# Patient Record
Sex: Male | Born: 1960 | Race: White | Hispanic: No | Marital: Single | Smoking: Former smoker
Health system: Southern US, Community
[De-identification: ages and names within clinical notes are randomized; demographics above are authoritative.]

---

## 1999-11-01 ENCOUNTER — Emergency Department (HOSPITAL_COMMUNITY): Admission: EM | Admit: 1999-11-01 | Discharge: 1999-11-01 | Payer: Self-pay | Admitting: Emergency Medicine

## 2000-05-04 ENCOUNTER — Inpatient Hospital Stay (HOSPITAL_COMMUNITY): Admission: EM | Admit: 2000-05-04 | Discharge: 2000-05-06 | Payer: Self-pay | Admitting: Addiction Psychiatry

## 2000-09-21 ENCOUNTER — Emergency Department (HOSPITAL_COMMUNITY): Admission: EM | Admit: 2000-09-21 | Discharge: 2000-09-21 | Payer: Self-pay | Admitting: Emergency Medicine

## 2006-05-01 ENCOUNTER — Emergency Department (HOSPITAL_COMMUNITY): Admission: EM | Admit: 2006-05-01 | Discharge: 2006-05-01 | Payer: Self-pay | Admitting: Family Medicine

## 2006-09-03 ENCOUNTER — Emergency Department (HOSPITAL_COMMUNITY): Admission: EM | Admit: 2006-09-03 | Discharge: 2006-09-03 | Payer: Self-pay | Admitting: Family Medicine

## 2006-10-28 ENCOUNTER — Emergency Department (HOSPITAL_COMMUNITY): Admission: EM | Admit: 2006-10-28 | Discharge: 2006-10-28 | Payer: Self-pay | Admitting: Family Medicine

## 2007-08-20 ENCOUNTER — Emergency Department (HOSPITAL_COMMUNITY): Admission: EM | Admit: 2007-08-20 | Discharge: 2007-08-20 | Payer: Self-pay | Admitting: Emergency Medicine

## 2009-11-17 ENCOUNTER — Ambulatory Visit (HOSPITAL_COMMUNITY): Admission: RE | Admit: 2009-11-17 | Discharge: 2009-11-17 | Payer: Self-pay | Admitting: Pulmonary Disease

## 2010-10-03 ENCOUNTER — Emergency Department (HOSPITAL_COMMUNITY): Admission: EM | Admit: 2010-10-03 | Discharge: 2010-10-03 | Payer: Self-pay | Admitting: Family Medicine

## 2010-10-03 ENCOUNTER — Emergency Department (HOSPITAL_COMMUNITY): Admission: EM | Admit: 2010-10-03 | Discharge: 2010-10-03 | Payer: Self-pay | Admitting: Emergency Medicine

## 2010-12-08 ENCOUNTER — Encounter
Admission: RE | Admit: 2010-12-08 | Discharge: 2010-12-12 | Payer: Self-pay | Source: Home / Self Care | Attending: Diagnostic Neuroimaging | Admitting: Diagnostic Neuroimaging

## 2010-12-13 ENCOUNTER — Ambulatory Visit: Payer: MEDICARE | Attending: Diagnostic Neuroimaging | Admitting: Physical Therapy

## 2010-12-13 DIAGNOSIS — M25579 Pain in unspecified ankle and joints of unspecified foot: Secondary | ICD-10-CM | POA: Insufficient documentation

## 2010-12-13 DIAGNOSIS — M629 Disorder of muscle, unspecified: Secondary | ICD-10-CM | POA: Insufficient documentation

## 2010-12-13 DIAGNOSIS — IMO0001 Reserved for inherently not codable concepts without codable children: Secondary | ICD-10-CM | POA: Insufficient documentation

## 2010-12-13 DIAGNOSIS — M2420 Disorder of ligament, unspecified site: Secondary | ICD-10-CM | POA: Insufficient documentation

## 2010-12-13 DIAGNOSIS — R269 Unspecified abnormalities of gait and mobility: Secondary | ICD-10-CM | POA: Insufficient documentation

## 2010-12-15 ENCOUNTER — Ambulatory Visit: Payer: MEDICARE | Admitting: Physical Therapy

## 2010-12-19 ENCOUNTER — Ambulatory Visit: Payer: Self-pay | Admitting: Physical Therapy

## 2010-12-21 ENCOUNTER — Ambulatory Visit: Payer: Self-pay | Admitting: Physical Therapy

## 2010-12-26 ENCOUNTER — Ambulatory Visit: Payer: MEDICARE | Admitting: Physical Therapy

## 2010-12-29 ENCOUNTER — Ambulatory Visit: Payer: MEDICARE | Admitting: Physical Therapy

## 2011-01-02 ENCOUNTER — Ambulatory Visit: Payer: MEDICARE | Admitting: Physical Therapy

## 2011-01-04 ENCOUNTER — Ambulatory Visit: Payer: Self-pay | Admitting: Physical Therapy

## 2011-01-09 ENCOUNTER — Ambulatory Visit: Payer: MEDICARE | Admitting: Physical Therapy

## 2011-01-12 ENCOUNTER — Ambulatory Visit: Payer: MEDICARE | Admitting: Physical Therapy

## 2011-01-15 ENCOUNTER — Ambulatory Visit: Payer: MEDICARE | Admitting: Physical Therapy

## 2011-01-17 ENCOUNTER — Ambulatory Visit: Payer: MEDICARE | Admitting: Physical Therapy

## 2011-01-22 ENCOUNTER — Ambulatory Visit: Payer: MEDICARE | Admitting: Physical Therapy

## 2011-01-24 ENCOUNTER — Ambulatory Visit: Payer: MEDICARE | Admitting: Physical Therapy

## 2011-01-30 ENCOUNTER — Ambulatory Visit: Payer: MEDICARE | Admitting: Physical Therapy

## 2011-02-01 ENCOUNTER — Ambulatory Visit: Payer: MEDICARE | Admitting: Physical Therapy

## 2011-05-07 IMAGING — CT CT HEAD W/O CM
1 series · 16 of 30 positions shown, 20 images · non-contrast
Comparison: None

CLINICAL DATA: Memory loss

CT HEAD WITHOUT CONTRAST
TECHNIQUE: Contiguous axial images were obtained from the base of
the skull through the vertex without contrast.

[Series 2: headseq 4.8 h37s · axial · 0.45mm/px · z∈[+140,+295]mm · 16 of 36 slices shown, 20 images]
[im 2/36  brain]
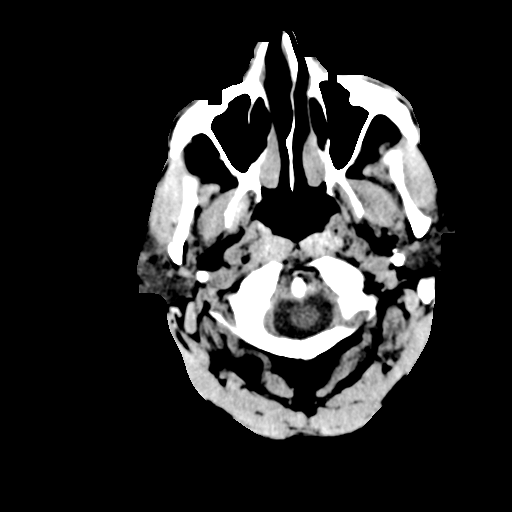
[im 2/36  bone]
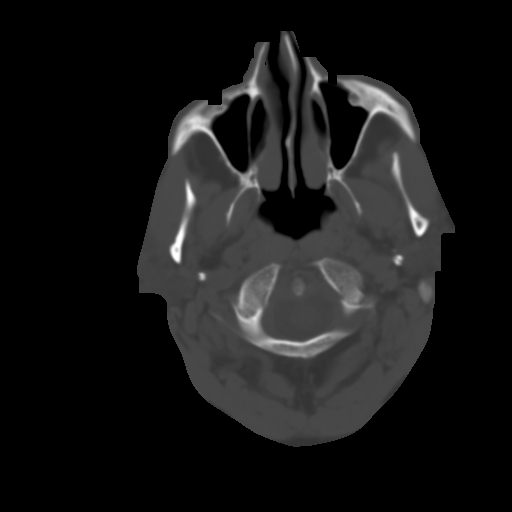
[im 4/36  brain]
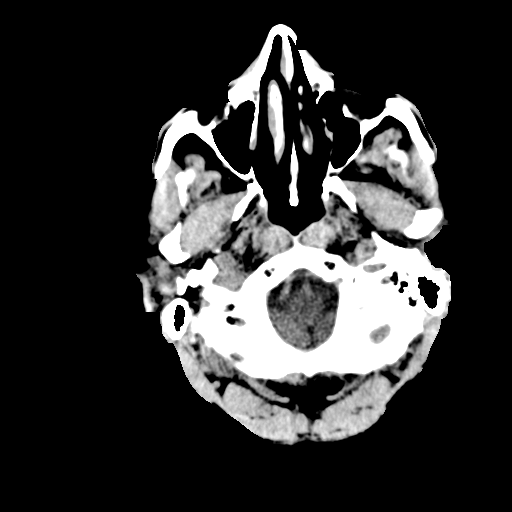
[im 7/36  brain]
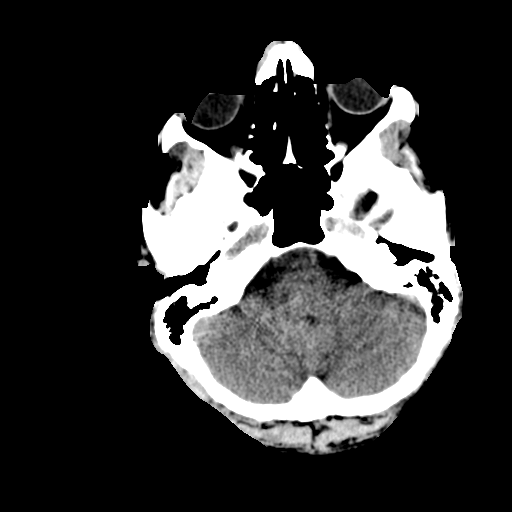
[im 9/36  brain]
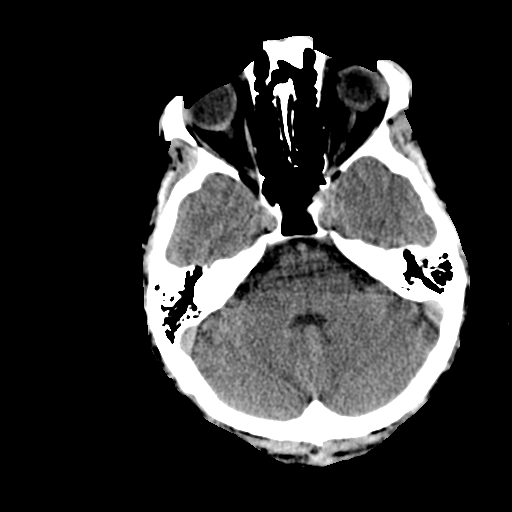
[im 10/36  brain]
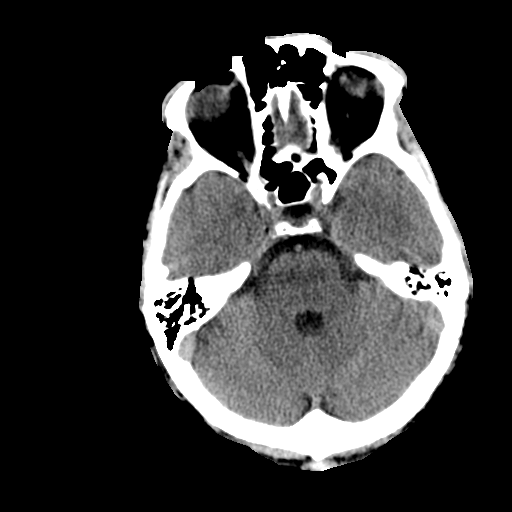
[im 10/36  bone]
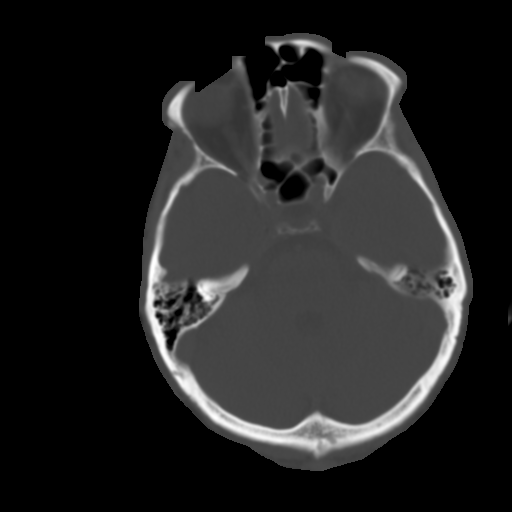
[im 13/36  brain]
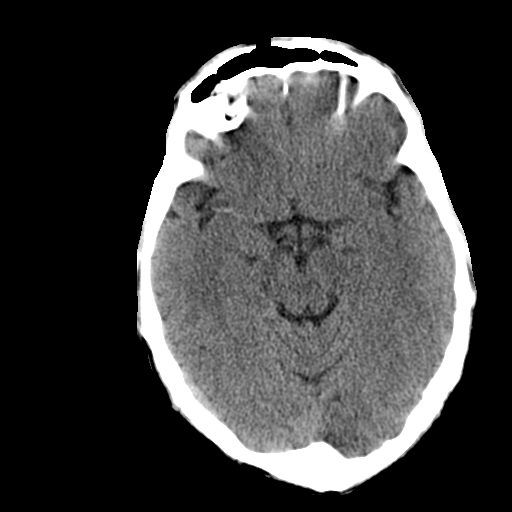
[im 15/36  brain]
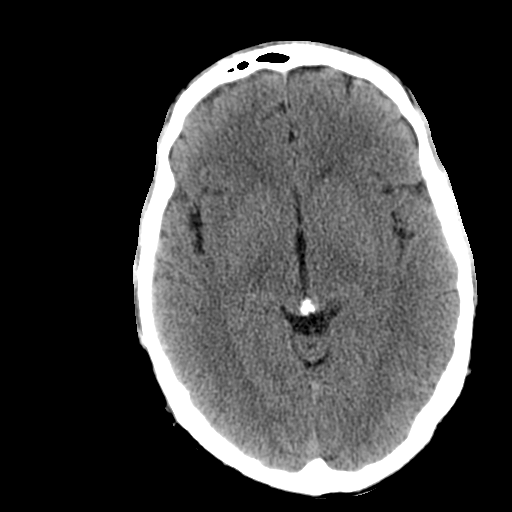
[im 17/36  brain]
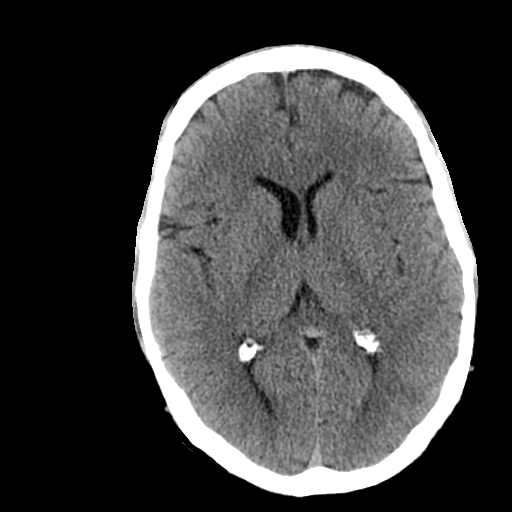
[im 19/36  brain]
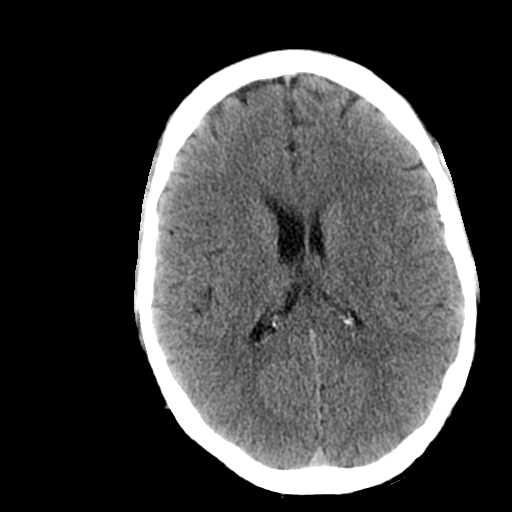
[im 19/36  bone]
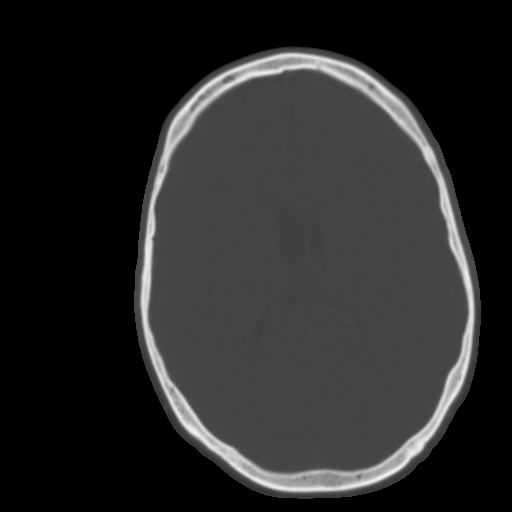
[im 21/36  brain]
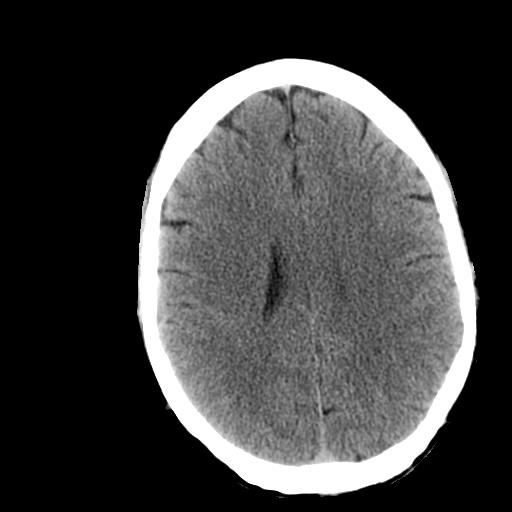
[im 23/36  brain]
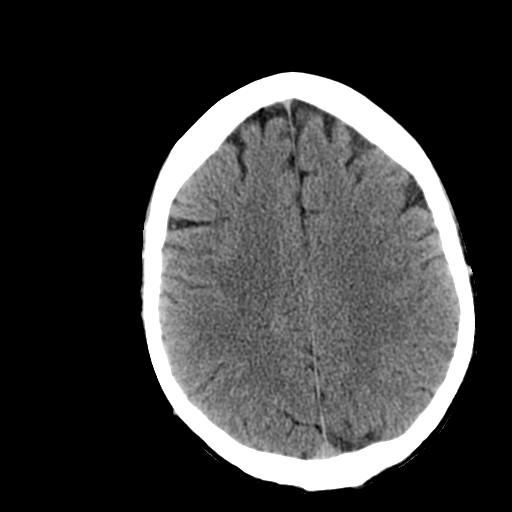
[im 26/36  brain]
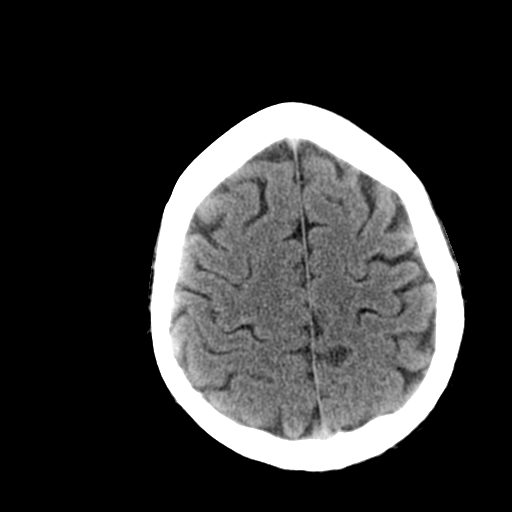
[im 27/36  brain]
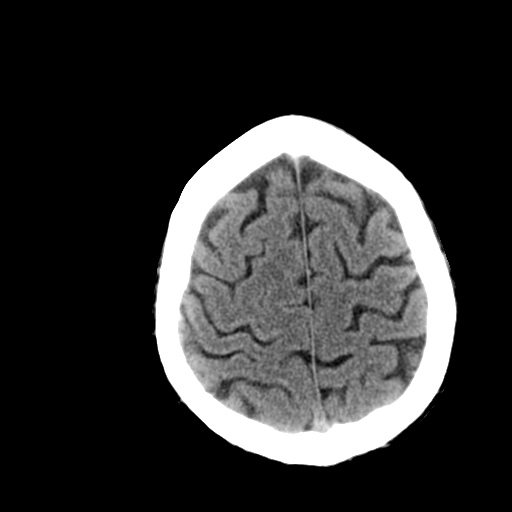
[im 27/36  bone]
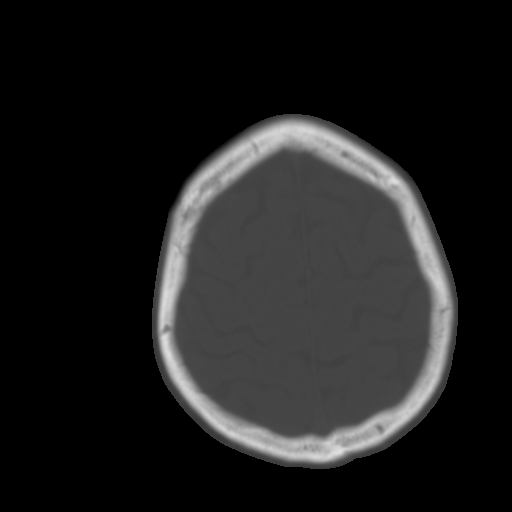
[im 29/36  brain]
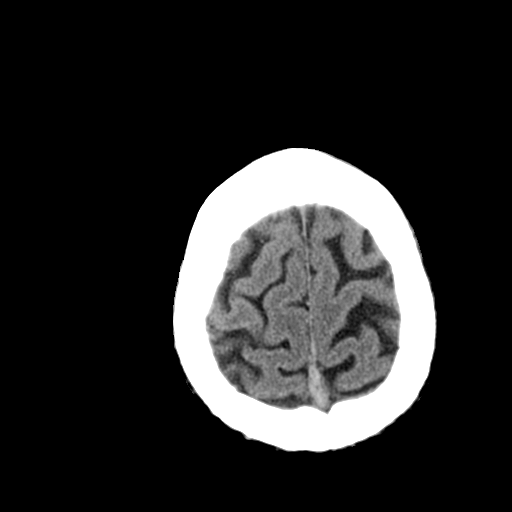
[im 32/36  brain]
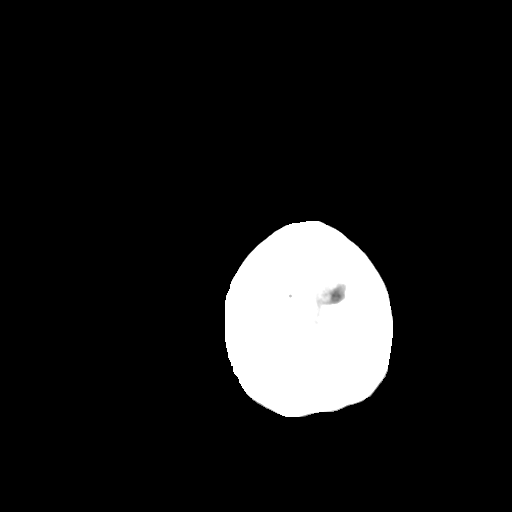
[im 34/36  brain]
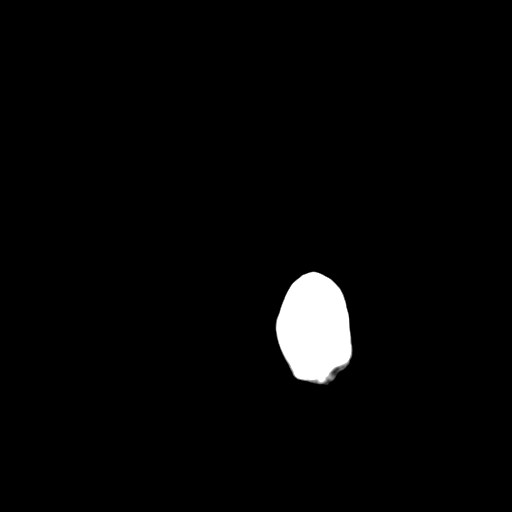

[16 of 30 positions shown; findings below may reference images not displayed]

FINDINGS: Normal ventricular morphology.
No midline shift or mass effect.
Normal appearance of brain parenchyma.
No intracranial hemorrhage, mass lesion, or acute infarction.
Visualized paranasal sinuses and mastoid air cells clear.
Bones unremarkable.
IMPRESSION: No acute intracranial abnormalities.

## 2011-08-15 ENCOUNTER — Inpatient Hospital Stay (INDEPENDENT_AMBULATORY_CARE_PROVIDER_SITE_OTHER)
Admission: RE | Admit: 2011-08-15 | Discharge: 2011-08-15 | Disposition: A | Discharge status: Home / Self Care | Payer: Medicare Other | Source: Ambulatory Visit | Attending: Emergency Medicine | Admitting: Emergency Medicine

## 2011-08-15 DIAGNOSIS — K5289 Other specified noninfective gastroenteritis and colitis: Secondary | ICD-10-CM

## 2011-08-15 DIAGNOSIS — K649 Unspecified hemorrhoids: Secondary | ICD-10-CM

## 2011-08-15 LAB — OCCULT BLOOD, POC DEVICE: Fecal Occult Bld: POSITIVE

## 2011-08-23 LAB — I-STAT 8, (EC8 V) (CONVERTED LAB)
Acid-Base Excess: 3 — ABNORMAL HIGH
Bicarbonate: 27.4 — ABNORMAL HIGH
Glucose, Bld: 93
HCT: 49
Hemoglobin: 16.7
TCO2: 29
pCO2, Ven: 38.5 — ABNORMAL LOW

## 2011-08-23 LAB — TSH: TSH: 0.533

## 2013-11-17 ENCOUNTER — Ambulatory Visit (INDEPENDENT_AMBULATORY_CARE_PROVIDER_SITE_OTHER): Payer: 59 | Admitting: Professional

## 2013-11-17 DIAGNOSIS — F411 Generalized anxiety disorder: Secondary | ICD-10-CM

## 2013-11-24 ENCOUNTER — Ambulatory Visit (INDEPENDENT_AMBULATORY_CARE_PROVIDER_SITE_OTHER): Payer: 59 | Admitting: Professional

## 2013-11-24 DIAGNOSIS — F411 Generalized anxiety disorder: Secondary | ICD-10-CM

## 2013-12-01 ENCOUNTER — Ambulatory Visit (INDEPENDENT_AMBULATORY_CARE_PROVIDER_SITE_OTHER): Payer: 59 | Admitting: Professional

## 2013-12-01 DIAGNOSIS — F411 Generalized anxiety disorder: Secondary | ICD-10-CM

## 2013-12-08 ENCOUNTER — Ambulatory Visit (INDEPENDENT_AMBULATORY_CARE_PROVIDER_SITE_OTHER): Payer: 59 | Admitting: Professional

## 2013-12-08 DIAGNOSIS — F411 Generalized anxiety disorder: Secondary | ICD-10-CM

## 2013-12-15 ENCOUNTER — Ambulatory Visit (INDEPENDENT_AMBULATORY_CARE_PROVIDER_SITE_OTHER): Payer: 59 | Admitting: Professional

## 2013-12-15 DIAGNOSIS — F411 Generalized anxiety disorder: Secondary | ICD-10-CM

## 2013-12-22 ENCOUNTER — Ambulatory Visit (INDEPENDENT_AMBULATORY_CARE_PROVIDER_SITE_OTHER): Payer: 59 | Admitting: Professional

## 2013-12-22 DIAGNOSIS — F411 Generalized anxiety disorder: Secondary | ICD-10-CM

## 2014-03-22 ENCOUNTER — Ambulatory Visit: Payer: Self-pay | Admitting: Diagnostic Neuroimaging

## 2014-03-24 ENCOUNTER — Ambulatory Visit (INDEPENDENT_AMBULATORY_CARE_PROVIDER_SITE_OTHER): Payer: Medicare Other | Admitting: Diagnostic Neuroimaging

## 2014-03-24 ENCOUNTER — Encounter: Payer: Self-pay | Admitting: Diagnostic Neuroimaging

## 2014-03-24 ENCOUNTER — Ambulatory Visit: Payer: Self-pay | Admitting: Nurse Practitioner

## 2014-03-24 ENCOUNTER — Encounter (INDEPENDENT_AMBULATORY_CARE_PROVIDER_SITE_OTHER): Payer: Self-pay

## 2014-03-24 VITALS — BP 106/76 | HR 75 | Ht 68.0 in | Wt 175.0 lb

## 2014-03-24 DIAGNOSIS — G801 Spastic diplegic cerebral palsy: Secondary | ICD-10-CM

## 2014-03-24 DIAGNOSIS — G808 Other cerebral palsy: Secondary | ICD-10-CM

## 2014-03-24 NOTE — Progress Notes (Signed)
[facility]  PATIENT: Ian Hernandez DOB: 1960-12-19  REFERRING CLINICIAN:  HISTORY FROM: patient  REASON FOR VISIT: follow up   HISTORICAL  CHIEF COMPLAINT:  Chief Complaint  Patient presents with  . Follow-up    cerebral palsy    HISTORY OF PRESENT ILLNESS:   UPDATE 03/24/14: Since last visit, continues with spastic gait. Still with low back pain, ankle pain, now with new left anterior hip/thigh/groin pain (1-2/10 in severity) for past 2-3 months. Previously tried PT, xanaflex, baclofen.  PRIOR HPI (05/09/12, CM): 53 year old white male returns today for followup. He was last seen in this office 04/30/11.  He has a history of cerebral palsy, leg pain and back pain. He has had a history of restless legs. He was evaluated for a sleep study by Dr. Merlene Laughter but we do not have those results. Records of his CT unfortunately are no longer available. He has been tried on baclofen in the past which caused him to fall more. In addition he felt lightheaded and stopped the drug. He has tried Zanaflex with similar side effects. He is on disability for bipolar disorder. He is  seeing Sula Soda.  He says he is currently doing behavior therapy with a counselor.   He claims he is exercising several times a week  by walking.  No new neurologic complaints.   REVIEW OF SYSTEMS: Full 14 system review of systems performed and notable only for restless legs insomnia snoring.   ALLERGIES: Allergies  Allergen Reactions  . Pollen Extract     HOME MEDICATIONS: No outpatient prescriptions prior to visit.   No facility-administered medications prior to visit.    PAST MEDICAL HISTORY: History reviewed. No pertinent past medical history.  PAST SURGICAL HISTORY: History reviewed. No pertinent past surgical history.  FAMILY HISTORY: Family History  Problem Relation Age of Onset  . Other Father   . Suicidality Paternal Aunt     SOCIAL HISTORY:  History   Social  History  . Marital Status: Single    Spouse Name: N/A    Number of Children: 0  . Years of Education: college   Occupational History  .  Other     disability   Social History Main Topics  . Smoking status: Former Smoker -- 0.75 packs/day for 2 years    Types: Cigarettes    Quit date: 11/13/1979  . Smokeless tobacco: Never Used  . Alcohol Use: No  . Drug Use: No  . Sexual Activity: Not on file   Other Topics Concern  . Not on file   Social History Narrative   Patient lives at home with family.   Caffeine Use: 4-5 cups daily     PHYSICAL EXAM  Filed Vitals:   03/24/14 1445  BP: 106/76  Pulse: 75  Height: 5\' 8"  (1.727 m)  Weight: 175 lb (79.379 kg)    Not recorded    Body mass index is 26.61 kg/(m^2).  GENERAL EXAM: Patient is in no distress; well developed, nourished and groomed; neck is supple  CARDIOVASCULAR: Regular rate and rhythm, no murmurs, no carotid bruits  NEUROLOGIC: MENTAL STATUS: awake, alert, language fluent, comprehension intact, naming intact, fund of knowledge appropriate CRANIAL NERVE: no papilledema on fundoscopic exam, pupils equal and reactive to light, visual fields full to confrontation, extraocular muscles intact, no nystagmus, facial sensation and strength symmetric, hearing intact, palate elevates symmetrically, uvula midline, shoulder shrug symmetric, tongue midline. MOTOR: normal bulk; BUE NORMAL TONE, FULL STRENGTH. INCREASED TONE IN BLE; BLE  3-4/5.  SENSORY: normal and symmetric to light touch, temperature, vibration COORDINATION: finger-nose-finger, fine finger movements normal REFLEXES: deep tendon reflexes present and symmetric; 1+ GAIT/STATION: SPASTIC DIPLEGIC GAIT.     DIAGNOSTIC DATA (LABS, IMAGING, TESTING) - I reviewed patient records, labs, notes, testing and imaging myself where available.  Lab Results  Component Value Date   HGB 16.7 08/20/2007   HCT 49.0 08/20/2007      Component Value Date/Time   NA 140  08/20/2007 1645   K 3.8 08/20/2007 1645   CL 105 08/20/2007 1645   GLUCOSE 93 08/20/2007 1645   BUN 8 08/20/2007 1645   CREATININE 1.1 08/20/2007 1648   No results found for this basename: CHOL, HDL, LDLCALC, LDLDIRECT, TRIG, CHOLHDL   No results found for this basename: HGBA1C   No results found for this basename: VITAMINB12   Lab Results  Component Value Date   TSH 0.533 **Test methodology is 3rd generation TSH** 08/20/2007    ASSESSMENT AND PLAN  53 y.o. year old male here with [facility]. Stable.   PLAN: - encouraged physical activity, stretching exercises, healthy habits - follow up with PCP; if pain or gait worsen in future, may consider PT or pain mgmt evaluation  Return for return to PCP.    Penni Bombard, MD 7/78/2423, 5:36 PM Certified in Neurology, Neurophysiology and Neuroimaging  [facility] [address], [place], [place] [address] [phone]

## 2014-03-24 NOTE — Patient Instructions (Signed)
Follow up with PCP

## 2014-03-26 ENCOUNTER — Other Ambulatory Visit: Payer: Self-pay

## 2014-03-26 DIAGNOSIS — R269 Unspecified abnormalities of gait and mobility: Secondary | ICD-10-CM

## 2014-04-01 ENCOUNTER — Ambulatory Visit: Payer: Medicare Other | Attending: Diagnostic Neuroimaging

## 2014-04-01 DIAGNOSIS — R269 Unspecified abnormalities of gait and mobility: Secondary | ICD-10-CM | POA: Insufficient documentation

## 2014-04-01 DIAGNOSIS — R279 Unspecified lack of coordination: Secondary | ICD-10-CM | POA: Insufficient documentation

## 2014-04-01 DIAGNOSIS — IMO0001 Reserved for inherently not codable concepts without codable children: Secondary | ICD-10-CM | POA: Insufficient documentation

## 2014-04-12 ENCOUNTER — Ambulatory Visit: Payer: Medicare Other | Admitting: Physical Therapy

## 2014-04-16 ENCOUNTER — Ambulatory Visit: Payer: Medicare Other | Attending: Diagnostic Neuroimaging

## 2014-04-16 DIAGNOSIS — IMO0001 Reserved for inherently not codable concepts without codable children: Secondary | ICD-10-CM | POA: Diagnosis present

## 2014-04-16 DIAGNOSIS — R269 Unspecified abnormalities of gait and mobility: Secondary | ICD-10-CM | POA: Diagnosis not present

## 2014-04-16 DIAGNOSIS — R279 Unspecified lack of coordination: Secondary | ICD-10-CM | POA: Diagnosis not present

## 2014-04-19 ENCOUNTER — Ambulatory Visit: Payer: Medicare Other | Admitting: Physical Therapy

## 2014-04-21 ENCOUNTER — Ambulatory Visit: Payer: Medicare Other

## 2014-04-21 DIAGNOSIS — IMO0001 Reserved for inherently not codable concepts without codable children: Secondary | ICD-10-CM | POA: Diagnosis not present

## 2014-04-26 ENCOUNTER — Ambulatory Visit: Payer: Medicare Other | Admitting: Physical Therapy

## 2014-04-28 ENCOUNTER — Ambulatory Visit: Payer: Medicare Other

## 2014-05-03 ENCOUNTER — Ambulatory Visit: Payer: Self-pay | Admitting: Physical Therapy

## 2014-05-06 ENCOUNTER — Ambulatory Visit: Payer: Self-pay

## 2014-05-10 ENCOUNTER — Ambulatory Visit: Payer: Medicare Other

## 2014-06-17 ENCOUNTER — Telehealth: Payer: Self-pay | Admitting: Diagnostic Neuroimaging

## 2014-06-17 DIAGNOSIS — M545 Low back pain: Secondary | ICD-10-CM

## 2014-06-17 NOTE — Telephone Encounter (Signed)
Patient requesting referral to [facility].  Please call anytime and advise, if not available please leave message on vm.  Thanks

## 2014-10-27 ENCOUNTER — Ambulatory Visit (HOSPITAL_COMMUNITY)
Admission: RE | Admit: 2014-10-27 | Discharge: 2014-10-27 | Disposition: A | Discharge status: Home / Self Care | Payer: 59 | Attending: Psychiatry | Admitting: Psychiatry
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
## 2014-11-02 ENCOUNTER — Ambulatory Visit: Payer: 59 | Admitting: Professional

## 2014-11-16 ENCOUNTER — Ambulatory Visit: Payer: 59 | Admitting: Professional

## 2014-11-30 ENCOUNTER — Ambulatory Visit (INDEPENDENT_AMBULATORY_CARE_PROVIDER_SITE_OTHER): Payer: 59 | Admitting: Professional

## 2014-11-30 DIAGNOSIS — F411 Generalized anxiety disorder: Secondary | ICD-10-CM

## 2015-02-01 ENCOUNTER — Telehealth: Payer: Self-pay | Admitting: *Deleted

## 2015-02-01 DIAGNOSIS — Z0289 Encounter for other administrative examinations: Secondary | ICD-10-CM

## 2015-02-01 NOTE — Telephone Encounter (Signed)
Form,DMV sent to [facility] and Dr Leta Baptist 02-01-15.

## 2015-02-11 ENCOUNTER — Ambulatory Visit (INDEPENDENT_AMBULATORY_CARE_PROVIDER_SITE_OTHER): Payer: 59 | Admitting: Nurse Practitioner

## 2015-02-11 ENCOUNTER — Encounter: Payer: Self-pay | Admitting: Nurse Practitioner

## 2015-02-11 VITALS — BP 108/72 | HR 71 | Ht 68.0 in | Wt 172.6 lb

## 2015-02-11 DIAGNOSIS — G801 Spastic diplegic cerebral palsy: Secondary | ICD-10-CM | POA: Diagnosis not present

## 2015-02-11 NOTE — Progress Notes (Signed)
[facility]  PATIENT: Ian Hernandez DOB: 1960-12-24   REASON FOR VISIT: Follow-up  HISTORY FROM: Patient    HISTORY OF PRESENT ILLNESS: Ian Hernandez, 54 year old male returns for follow-up because of DMV forms. He was last seen in this office by Dr. Leta Baptist 03/24/2014 he has a history of cerebral palsy and continues with spastic gait. Still with low back pain, ankle pain, and  hip/thigh/groin pain (1-2/10 in severity).Previously tried PT, xanaflex, baclofen not very effective. He was asked to see a pain physician at his last visit if his pain was significant. He has not felt that it was. He continues to see his psychiatrist Dr. Toy Care. He returns for reevaluation to have DMV form filled out.  HISTORY:  He has a history of cerebral palsy, leg pain and back pain. He has had a history of restless legs. He was evaluated for a sleep study by Dr. Merlene Laughter but we do not have those results. Records of his CT unfortunately are no longer available. He has been tried on baclofen in the past which caused him to fall more. In addition he felt lightheaded and stopped the drug. He has tried Zanaflex with similar side effects. He is on disability for bipolar disorder. He is seeing Sula Soda. He says he is currently doing behavior therapy with a counselor. He claims he is exercising several times a week by walking. No new neurologic complaints.    REVIEW OF SYSTEMS: Full 14 system review of systems performed and notable only for those listed, all others are neg:  Constitutional: neg  Cardiovascular: neg Ear/Nose/Throat: neg  Skin: neg Eyes: neg Respiratory: neg Gastroitestinal: neg  Hematology/Lymphatic: neg  Endocrine: neg Musculoskeletal:neg Allergy/Immunology: neg Neurological: neg Psychiatric: neg Sleep : Restless legs, insomnia ALLERGIES: Allergies  Allergen Reactions  . Pollen Extract     HOME MEDICATIONS: Outpatient Prescriptions Prior to Visit    Medication Sig Dispense Refill  . diphenhydrAMINE (BENADRYL) 12.5 MG/5ML liquid Take by mouth 4 (four) times daily as needed.     No facility-administered medications prior to visit.    PAST MEDICAL HISTORY: CP insomnia, bipolar disorder PAST SURGICAL HISTORY: History reviewed. No pertinent past surgical history.  FAMILY HISTORY: Family History  Problem Relation Age of Onset  . Other Father   . Suicidality Paternal Aunt     SOCIAL HISTORY: History   Social History  . Marital Status: Single    Spouse Name: N/A  . Number of Children: 0  . Years of Education: college   Occupational History  .  Other     disability   Social History Main Topics  . Smoking status: Former Smoker -- 0.75 packs/day for 2 years    Types: Cigarettes    Quit date: 11/13/1979  . Smokeless tobacco: Never Used  . Alcohol Use: No  . Drug Use: No  . Sexual Activity: Not on file   Other Topics Concern  . Not on file   Social History Narrative   Patient lives at home with family.   Caffeine Use: 4-5 cups daily     PHYSICAL EXAM  Filed Vitals:   02/11/15 1012  BP: 108/72  Pulse: 71  Height: 5\' 8"  (1.727 m)  Weight: 172 lb 9.6 oz (78.291 kg)   Body mass index is 26.25 kg/(m^2). GENERAL EXAM: Patient is in no distress; well developed, nourished and groomed; neck is supple  CARDIOVASCULAR: Regular rate and rhythm, no murmurs, no carotid bruits  NEUROLOGIC: MENTAL STATUS: awake, alert, language fluent, comprehension  intact, naming intact, fund of knowledge appropriate CRANIAL NERVE: no papilledema on fundoscopic exam, pupils equal and reactive to light, visual fields full to confrontation, extraocular muscles intact, no nystagmus, facial sensation and strength symmetric, hearing intact, palate elevates symmetrically, uvula midline, shoulder shrug symmetric, tongue midline. MOTOR: normal bulk; BUE NORMAL TONE, FULL STRENGTH. INCREASED TONE IN BLE; BLE 3-4/5.  SENSORY: normal and  symmetric to light touch, temperature, vibration COORDINATION: finger-nose-finger, fine finger movements normal REFLEXES: deep tendon reflexes present and symmetric; 1+ GAIT/STATION: SPASTIC DIPLEGIC GAIT.     DIAGNOSTIC DATA (LABS, IMAGING, TESTING)   ASSESSMENT AND PLAN  54 y.o. year old male  with history of cerebral palsy  spastic gait. He has failed baclofen and Zanaflex in the past.The patient is a current patient of Dr. Corwin Levins  who is out of the office today . This note is sent to the work in doctor.     Handicap form filled out for patient Will process DMV form Follow-up when necessary only [name], [facility], [facility], [facility] [address] [place], [place] [address] [phone]

## 2015-02-11 NOTE — Telephone Encounter (Signed)
Form,DMV received,completed by Bari Mantis faxed copy at front desk for patient 02-11-15.

## 2015-02-11 NOTE — Progress Notes (Signed)
I have read the note, and I agree with the clinical assessment and plan.  Lillyona Polasek KEITH   

## 2015-02-11 NOTE — Patient Instructions (Signed)
Handicap form filled out for patient Will process DMV form Follow-up when necessary only

## 2015-02-11 NOTE — Telephone Encounter (Signed)
Returned form to medical records.-Ian Hernandez.

## 2015-03-23 ENCOUNTER — Emergency Department (HOSPITAL_COMMUNITY)
Admission: EM | Admit: 2015-03-23 | Discharge: 2015-03-23 | Discharge status: Against Medical Advice | Payer: Medicare Other | Attending: Emergency Medicine | Admitting: Emergency Medicine

## 2015-03-23 ENCOUNTER — Encounter (HOSPITAL_COMMUNITY): Payer: Self-pay | Admitting: Emergency Medicine

## 2015-03-23 ENCOUNTER — Emergency Department (HOSPITAL_COMMUNITY): Payer: Medicare Other

## 2015-03-23 DIAGNOSIS — K625 Hemorrhage of anus and rectum: Secondary | ICD-10-CM | POA: Diagnosis present

## 2015-03-23 DIAGNOSIS — Z79899 Other long term (current) drug therapy: Secondary | ICD-10-CM | POA: Insufficient documentation

## 2015-03-23 DIAGNOSIS — K921 Melena: Secondary | ICD-10-CM | POA: Diagnosis not present

## 2015-03-23 DIAGNOSIS — K644 Residual hemorrhoidal skin tags: Secondary | ICD-10-CM | POA: Diagnosis not present

## 2015-03-23 DIAGNOSIS — K649 Unspecified hemorrhoids: Secondary | ICD-10-CM

## 2015-03-23 DIAGNOSIS — Z87891 Personal history of nicotine dependence: Secondary | ICD-10-CM | POA: Diagnosis not present

## 2015-03-23 LAB — CBC
HCT: 45 % (ref 39.0–52.0)
Hemoglobin: 14.9 g/dL (ref 13.0–17.0)
MCH: 29.8 pg (ref 26.0–34.0)
MCHC: 33.1 g/dL (ref 30.0–36.0)
MCV: 90 fL (ref 78.0–100.0)
PLATELETS: 154 10*3/uL (ref 150–400)
RBC: 5 MIL/uL (ref 4.22–5.81)
RDW: 12.9 % (ref 11.5–15.5)
WBC: 6.1 10*3/uL (ref 4.0–10.5)

## 2015-03-23 LAB — COMPREHENSIVE METABOLIC PANEL
ALBUMIN: 4.3 g/dL (ref 3.5–5.0)
ALT: 30 U/L (ref 17–63)
ANION GAP: 7 (ref 5–15)
AST: 30 U/L (ref 15–41)
Alkaline Phosphatase: 106 U/L (ref 38–126)
BUN: 12 mg/dL (ref 6–20)
CALCIUM: 9.3 mg/dL (ref 8.9–10.3)
CO2: 30 mmol/L (ref 22–32)
CREATININE: 0.93 mg/dL (ref 0.61–1.24)
Chloride: 104 mmol/L (ref 101–111)
GFR calc Af Amer: >60 mL/min (ref >60)
GFR calc non Af Amer: >60 mL/min (ref >60)
GLUCOSE: 90 mg/dL (ref 70–99)
POTASSIUM: 3.6 mmol/L (ref 3.5–5.1)
Sodium: 141 mmol/L (ref 135–145)
TOTAL PROTEIN: 7.2 g/dL (ref 6.5–8.1)
Total Bilirubin: 1.5 mg/dL — ABNORMAL HIGH (ref 0.3–1.2)

## 2015-03-23 LAB — POC OCCULT BLOOD, ED: FECAL OCCULT BLD: POSITIVE — AB

## 2015-03-23 MED ORDER — HYDROCORTISONE 2.5 % RE CREA: TOPICAL_CREAM | RECTAL | Status: DC

## 2015-03-23 MED ORDER — SODIUM CHLORIDE 0.9 % IJ SOLN
INTRAMUSCULAR | Status: AC
  Filled 2015-03-23: qty 500

## 2015-03-23 MED ORDER — SODIUM CHLORIDE 0.9 % IJ SOLN
INTRAMUSCULAR | Status: AC
  Filled 2015-03-23: qty 60

## 2015-03-23 NOTE — ED Notes (Signed)
Pt states that he had a bowel movement about 1 hour ago and there was blood in toilet after he finished.

## 2015-03-23 NOTE — ED Notes (Signed)
Pt refusing IV which is needed for CT scan that has been ordered. Stated he wasn't sure if he even wanted CT scan and requesting MD to come back and talk with him again regarding xray. Dr. Wyvonnia Dusky notified and accompanied to pt's bedside to explain need for Ct scan. Pt still refused. At this time, no IV will be started and pt will not go to CT.

## 2015-03-23 NOTE — Discharge Instructions (Signed)
Bloody Stools You declined CT scan today. As discussed your bleeding is probably from hemorrhoids or diverticula. Follow-up with your doctor. Return to the ED if you develop new or worsening symptoms. Bloody stools often mean that there is a problem in the digestive tract. Your caregiver may use the term "melena" to describe black, tarry, and bad smelling stools or "hematochezia" to describe red or maroon-colored stools. Blood seen in the stool can be caused by bleeding anywhere along the intestinal tract.  A black stool usually means that blood is coming from the upper part of the gastrointestinal tract (esophagus, stomach, or small bowel). Passing maroon-colored stools or bright red blood usually means that blood is coming from lower down in the large bowel or the rectum. However, sometimes massive bleeding in the stomach or small intestine can cause bright red bloody stools.  Consuming black licorice, lead, iron pills, medicines containing bismuth subsalicylate, or blueberries can also cause black stools. Your caregiver can test black stools to see if blood is present. It is important that the cause of the bleeding be found. Treatment can then be started, and the problem can be corrected. Rectal bleeding may not be serious, but you should not assume everything is okay until you know the cause.It is very important to follow up with your caregiver or a specialist in gastrointestinal problems. CAUSES  Blood in the stools can come from various underlying causes.Often, the cause is not found during your first visit. Testing is often needed to discover the cause of bleeding in the gastrointestinal tract. Causes range from simple to serious or even life-threatening.Possible causes include:  Hemorrhoids.These are veins that are full of blood (engorged) in the rectum. They cause pain, inflammation, and may bleed.  Anal fissures.These are areas of painful tearing which may bleed. They are often caused by  passing hard stool.  Diverticulosis.These are pouches that form on the colon over time, with age, and may bleed significantly.  Diverticulitis.This is inflammation in areas with diverticulosis. It can cause pain, fever, and bloody stools, although bleeding is rare.  Proctitis and colitis. These are inflamed areas of the rectum or colon. They may cause pain, fever, and bloody stools.  Polyps and cancer. Colon cancer is a leading cause of preventable cancer death.It often starts out as precancerous polyps that can be removed during a colonoscopy, preventing progression into cancer. Sometimes, polyps and cancer may cause rectal bleeding.  Gastritis and ulcers.Bleeding from the upper gastrointestinal tract (near the stomach) may travel through the intestines and produce black, sometimes tarry, often bad smelling stools. In certain cases, if the bleeding is fast enough, the stools may not be black, but red and the condition may be life-threatening. SYMPTOMS  You may have stools that are bright red and bloody, that are normal color with blood on them, or that are dark black and tarry. In some cases, you may only have blood in the toilet bowl. Any of these cases need medical care. You may also have:  Pain at the anus or anywhere in the rectum.  Lightheadedness or feeling faint.  Extreme weakness.  Nausea or vomiting.  Fever. DIAGNOSIS Your caregiver may use the following methods to find the cause of your bleeding:  Taking a medical history. Age is important. Older people tend to develop polyps and cancer more often. If there is anal pain and a hard, large stool associated with bleeding, a tear of the anus may be the cause. If blood drips into the toilet after a  bowel movement, bleeding hemorrhoids may be the problem. The color and frequency of the bleeding are additional considerations. In most cases, the medical history provides clues, but seldom the final answer.  A visual and finger  (digital) exam. Your caregiver will inspect the anal area, looking for tears and hemorrhoids. A finger exam can provide information when there is tenderness or a growth inside. In men, the prostate is also examined.  Endoscopy. Several types of small, long scopes (endoscopes) are used to view the colon.  In the office, your caregiver may use a rigid, or more commonly, a flexible viewing sigmoidoscope. This exam is called flexible sigmoidoscopy. It is performed in 5 to 10 minutes.  A more thorough exam is accomplished with a colonoscope. It allows your caregiver to view the entire 5 to 6 foot long colon. Medicine to help you relax (sedative) is usually given for this exam. Frequently, a bleeding lesion may be present beyond the reach of the sigmoidoscope. So, a colonoscopy may be the best exam to start with. Both exams are usually done on an outpatient basis. This means the patient does not stay overnight in the hospital or surgery center.  An upper endoscopy may be needed to examine your stomach. Sedation is used and a flexible endoscope is put in your mouth, down to your stomach.  A barium enema X-ray. This is an X-ray exam. It uses liquid barium inserted by enema into the rectum. This test alone may not identify an actual bleeding point. X-rays highlight abnormal shadows, such as those made by lumps (tumors), diverticuli, or colitis. TREATMENT  Treatment depends on the cause of your bleeding.   For bleeding from the stomach or colon, the caregiver doing your endoscopy or colonoscopy may be able to stop the bleeding as part of the procedure.  Inflammation or infection of the colon can be treated with medicines.  Many rectal problems can be treated with creams, suppositories, or warm baths.  Surgery is sometimes needed.  Blood transfusions are sometimes needed if you have lost a lot of blood.  For any bleeding problem, let your caregiver know if you take aspirin or other blood thinners  regularly. HOME CARE INSTRUCTIONS   Take any medicines exactly as prescribed.  Keep your stools soft by eating a diet high in fiber. Prunes (1 to 3 a day) work well for many people.  Drink enough water and fluids to keep your urine clear or pale yellow.  Take sitz baths if advised. A sitz bath is when you sit in a bathtub with warm water for 10 to 15 minutes to soak, soothe, and cleanse the rectal area.  If enemas or suppositories are advised, be sure you know how to use them. Tell your caregiver if you have problems with this.  Monitor your bowel movements to look for signs of improvement or worsening. SEEK MEDICAL CARE IF:   You do not improve in the time expected.  Your condition worsens after initial improvement.  You develop any new symptoms. SEEK IMMEDIATE MEDICAL CARE IF:   You develop severe or prolonged rectal bleeding.  You vomit blood.  You feel weak or faint.  You have a fever. MAKE SURE YOU:  Understand these instructions.  Will watch your condition.  Will get help right away if you are not doing well or get worse. Document Released: 10/19/2002 Document Revised: 01/21/2012 Document Reviewed: 03/16/2011 [facility] Patient Information 2015 [place], [place]. This information is not intended to replace advice given to you by  your health care provider. Make sure you discuss any questions you have with your health care provider. ° °

## 2015-03-23 NOTE — ED Provider Notes (Signed)
CSN: [id number]     Arrival date & time 03/23/15  1217 History   First MD Initiated Contact with Patient 03/23/15 1258     Chief Complaint  Patient presents with  . Rectal Bleeding     (Consider location/radiation/quality/duration/timing/severity/associated sxs/prior Treatment) HPI Comments: Patient reports having bowel movement 1 hour ago and noticed blood in the toilet bowl and blood with wiping. He denies straining on the toilet. He denies any history of constipation. Denies any history of hemorrhoids. States several weeks ago he had some spots on toilet paper but no significant bleeding. Has some abdominal cramping. Is not take any blood thinners. Denies any chest pain, shortness of breath, nausea or vomiting. No fever or chills. No dizziness, lightheadedness, nausea or vomiting. He has never had a colonoscopy. He has a history of cerebral palsy and anxiety. He is unable to state what color the stool today was. He normally has brown bowel movements that are nonbloody. He denies straining.  The history is provided by the patient.    History reviewed. No pertinent past medical history. History reviewed. No pertinent past surgical history. Family History  Problem Relation Age of Onset  . Other Father   . Suicidality Paternal Aunt    History  Substance Use Topics  . Smoking status: Former Smoker -- 0.75 packs/day for 2 years    Types: Cigarettes    Quit date: 11/13/1979  . Smokeless tobacco: Never Used  . Alcohol Use: No    Review of Systems  Constitutional: Negative for fever, activity change and appetite change.  HENT: Negative for congestion.   Respiratory: Negative for cough, chest tightness and shortness of breath.   Gastrointestinal: Positive for abdominal pain, blood in stool, hematochezia and rectal pain. Negative for nausea, vomiting and diarrhea.  Genitourinary: Negative for dysuria, urgency and hematuria.  Musculoskeletal: Negative for myalgias and arthralgias.  Skin:  Negative for wound.  Neurological: Negative for dizziness, weakness, light-headedness and headaches.  A complete 10 system review of systems was obtained and all systems are negative except as noted in the HPI and PMH.      Allergies  Pollen extract  Home Medications   Prior to Admission medications   Medication Sig Start Date End Date Taking? Authorizing Provider  ALPRAZolam Duanne Moron) 1 MG tablet Take 1 tablet by mouth daily. 01/28/15  Yes Historical Provider, MD  hydrocortisone (ANUSOL-HC) 2.5 % rectal cream Apply rectally 2 times daily 03/23/15   Ezequiel Essex, MD   BP 102/72 mmHg  Pulse 70  Temp(Src) 98.3 F (36.8 C) (Oral)  Resp 18  Ht 5\' 8"  (1.727 m)  Wt 170 lb (77.111 kg)  BMI 25.85 kg/m2  SpO2 98% Physical Exam  Constitutional: He is oriented to person, place, and time. He appears well-developed and well-nourished. No distress.  HENT:  Head: Normocephalic and atraumatic.  Mouth/Throat: Oropharynx is clear and moist. No oropharyngeal exudate.  Eyes: Conjunctivae and EOM are normal. Pupils are equal, round, and reactive to light.  Neck: Normal range of motion. Neck supple.  No meningismus.  Cardiovascular: Normal rate, regular rhythm, normal heart sounds and intact distal pulses.   No murmur heard. Pulmonary/Chest: Effort normal and breath sounds normal. No respiratory distress.  Abdominal: Soft. There is tenderness. There is no rebound and no guarding.  Mildly tender periumbilical and right lower quadrant  Genitourinary: Guaiac positive stool.  Small external hemorrhoid, nonbleeding, nonthrombosed. No fissures. No gross blood. Hemoccult positive  Musculoskeletal: Normal range of motion. He exhibits no edema or  tenderness.  Neurological: He is alert and oriented to person, place, and time. No cranial nerve deficit. He exhibits normal muscle tone. Coordination normal.  No ataxia on finger to nose bilaterally. No pronator drift. 5/5 strength throughout. CN 2-12 intact.  Negative Romberg. Equal grip strength. Sensation intact. Gait is normal.   Skin: Skin is warm.  Psychiatric: He has a normal mood and affect. His behavior is normal.  Nursing note and vitals reviewed.   ED Course  Procedures (including critical care time) Labs Review Labs Reviewed  COMPREHENSIVE METABOLIC PANEL - Abnormal; Notable for the following:    Total Bilirubin 1.5 (*)    All other components within normal limits  POC OCCULT BLOOD, ED - Abnormal; Notable for the following:    Fecal Occult Bld POSITIVE (*)    All other components within normal limits  CBC    Imaging Review No results found.   EKG Interpretation None      MDM   Final diagnoses:  Hematochezia  Hemorrhoids, unspecified hemorrhoid type   Hematochezia with small hemorrhoid on exam. Hemoccult strongly positive.  Patient declines CT scan.   Vitals are stable. Hemoglobin is normal. Suspect patient's rectal bleeding is hemorrhoidal or diverticular. He declines CT scan though continues to complain of lower abdominal pain.  I discussed CT scan would be necessary to further evaluate his complaint but he does not want an IV. He also declines CT scan without contrast. He is anxious to leave and will leave against medical advice.  He appears to have capacity to make decisions.   Ezequiel Essex, MD 03/24/15 0010

## 2015-04-22 DIAGNOSIS — H8110 Benign paroxysmal vertigo, unspecified ear: Secondary | ICD-10-CM | POA: Diagnosis not present

## 2015-09-13 ENCOUNTER — Telehealth: Payer: Self-pay | Admitting: Nurse Practitioner

## 2015-09-13 DIAGNOSIS — M545 Low back pain: Secondary | ICD-10-CM

## 2015-09-13 NOTE — Telephone Encounter (Signed)
Patient should follow up with PCP. -VRP

## 2015-09-13 NOTE — Telephone Encounter (Signed)
Pt called inquiring if he needs to continue to coming to [place]. He sts Dr Leta Baptist recommended he see pain specialist. Although he feels pain is relatively minor. Please call and advise at [phone].

## 2015-09-13 NOTE — Telephone Encounter (Signed)
I called pt and he asked about the referral for PM (Dr. Neomia Dear).  I gave him phone #, but this was a year ago.  He would need another one since over a yr ago.  He had questions about SSD, has applied for and I told him that we do not do SSD or exams relating to disability, we only provide records if requested with release from him.  He verbalized understanding.  We could update referral for him since last saw 02-2015, and he if he desires it will be there for him.   He also asked about needing to be seen.  I told him that from last note, prn only.   He asked if SSD requires annual appts, I relayed that I did not know and he would have to touch base with them.

## 2015-09-14 NOTE — Telephone Encounter (Signed)
I cancelled up dated pain referral as per Dr. Leta Baptist to see pcp.

## 2015-09-14 NOTE — Telephone Encounter (Signed)
I called pt and LMVM on his home # that sent message to Dr. Leta Baptist and he stated he should f/u with pcp.  (if he is requesting a new referral to PM,  pcp could do this for him).  Pt is to call back if questions.

## 2015-09-21 DIAGNOSIS — M545 Low back pain: Secondary | ICD-10-CM | POA: Diagnosis not present

## 2015-09-21 DIAGNOSIS — M25572 Pain in left ankle and joints of left foot: Secondary | ICD-10-CM | POA: Diagnosis not present

## 2015-09-21 DIAGNOSIS — M6080 Other myositis, unspecified site: Secondary | ICD-10-CM | POA: Diagnosis not present

## 2015-09-21 DIAGNOSIS — G894 Chronic pain syndrome: Secondary | ICD-10-CM | POA: Diagnosis not present

## 2015-09-21 DIAGNOSIS — M25571 Pain in right ankle and joints of right foot: Secondary | ICD-10-CM | POA: Diagnosis not present

## 2016-04-11 ENCOUNTER — Ambulatory Visit (INDEPENDENT_AMBULATORY_CARE_PROVIDER_SITE_OTHER): Payer: Medicare Other | Admitting: Nurse Practitioner

## 2016-04-11 ENCOUNTER — Encounter: Payer: Self-pay | Admitting: Nurse Practitioner

## 2016-04-11 VITALS — BP 133/69 | HR 89 | Ht 68.0 in | Wt 173.4 lb

## 2016-04-11 DIAGNOSIS — G801 Spastic diplegic cerebral palsy: Secondary | ICD-10-CM | POA: Diagnosis not present

## 2016-04-11 NOTE — Patient Instructions (Addendum)
Will process DMV form Follow-up when necessary only

## 2016-04-11 NOTE — Progress Notes (Signed)
[facility]  PATIENT: Ian Hernandez DOB: 11-04-1961   REASON FOR VISIT: Cerebral palsy with spastic ataxic gait. HISTORY FROM: Patient    HISTORY OF PRESENT ILLNESS:Ian Hernandez, 55 year old male returns for follow-up because of DMV forms. He was last seen in this office 02/11/15. He has a history of cerebral palsy and continues with spastic gait. Still with low back pain, ankle pain, and hip/thigh/groin pain (1-2/10 in severity).Previously tried PT, xanaflex, baclofen not very effective. He was asked to see a pain physician at his last visit if his pain was significant. He has not felt that it was.  He returns for reevaluation to have DMV form filled out. He has not had any recent falls.  HISTORY: He has a history of cerebral palsy, leg pain and back pain. He has had a history of restless legs. He was evaluated for a sleep study by Dr. Merlene Laughter but we do not have those results. Records of his CT unfortunately are no longer available. He has been tried on baclofen in the past which caused him to fall more. In addition he felt lightheaded and stopped the drug. He has tried Zanaflex with similar side effects. He is on disability for bipolar disorder. He is seeing Sula Soda. He says he is currently doing behavior therapy with a counselor. He claims he is exercising several times a week by walking. No new neurologic complaints.    REVIEW OF SYSTEMS: Full 14 system review of systems performed and notable only for those listed, all others are neg:  Constitutional: neg  Cardiovascular: neg Ear/Nose/Throat: neg  Skin: neg Eyes: neg Respiratory: neg Gastroitestinal: neg  Hematology/Lymphatic: neg  Endocrine: neg Musculoskeletal:neg Allergy/Immunology: neg Neurological: neg Psychiatric: neg Sleep : neg   ALLERGIES: Allergies  Allergen Reactions  . Pollen Extract     HOME MEDICATIONS: Outpatient Prescriptions Prior to Visit  Medication Sig Dispense  Refill  . ALPRAZolam (XANAX) 1 MG tablet Take 1 tablet by mouth daily.  1  . hydrocortisone (ANUSOL-HC) 2.5 % rectal cream Apply rectally 2 times daily 28.35 g 0   No facility-administered medications prior to visit.    PAST MEDICAL HISTORY: History reviewed. No pertinent past medical history.  PAST SURGICAL HISTORY: History reviewed. No pertinent past surgical history.  FAMILY HISTORY: Family History  Problem Relation Age of Onset  . Other Father   . Suicidality Paternal Aunt     SOCIAL HISTORY: Social History   Social History  . Marital Status: Single    Spouse Name: N/A  . Number of Children: 0  . Years of Education: college   Occupational History  .  Other     disability   Social History Main Topics  . Smoking status: Former Smoker -- 0.75 packs/day for 2 years    Types: Cigarettes    Quit date: 11/13/1979  . Smokeless tobacco: Never Used  . Alcohol Use: No  . Drug Use: No  . Sexual Activity: Not on file   Other Topics Concern  . Not on file   Social History Narrative   Patient lives at home with family.   Caffeine Use: 4-5 cups daily     PHYSICAL EXAM  Filed Vitals:   04/11/16 1408  BP: 133/69  Pulse: 89  Height: 5\' 8"  (1.727 m)  Weight: 173 lb 6.4 oz (78.654 kg)   Body mass index is 26.37 kg/(m^2).  Generalized: Well developed, in no acute distress, well-groomed  Head: normocephalic and atraumatic,. Oropharynx benign  Neck: Supple, no  carotid bruits   Neurological examination   Mentation: Alert oriented to time, place, history taking. Attention span and concentration appropriate. Recent and remote memory intact.  Follows all commands speech and language fluent.  Cranial nerve II-XII: Pupils were equal round reactive to light extraocular movements were full, visual field were full on confrontational test. Facial sensation and strength were normal. hearing was intact to finger rubbing bilaterally. Uvula tongue midline. head turning and shoulder  shrug were normal and symmetric.Tongue protrusion into cheek strength was normal. Motor: normal bulk and tone, full strength in the BUE, increased tone in the bilateral lower extremities 3-4 out of 5 strength Sensory: normal and symmetric to light touch, pinprick, and  Vibration,  Coordination: finger-nose-finger,  no dysmetria Reflexes: 1+ upper lower and symmetric plantar responses were flexor bilaterally. Gait and Station: Rising up from seated position , spastic diplegic gait. No assistive device DIAGNOSTIC DATA (LABS, IMAGING, TESTING)  ASSESSMENT AND PLAN 55 y.o. year old male with history of cerebral palsy spastic gait. He has failed baclofen and Zanaflex in the past.He has not felt that his pain is significant enough to be referred to pain management. He has not had any recent falls   Will process DMV form Need to sign release No follow up planned [name], [facility], [facility], APRN  [facility] [address] [place], [place] [address] [phone]

## 2016-04-25 NOTE — Progress Notes (Signed)
I reviewed note and agree with plan.   Saki Legore R. Owyn Raulston, MD 04/25/2016, 7:45 PM Certified in Neurology, Neurophysiology and Neuroimaging  [facility] [address], [address] [place], [place] [address] [phone]  

## 2016-05-01 ENCOUNTER — Telehealth: Payer: Self-pay | Admitting: Nurse Practitioner

## 2016-05-01 NOTE — Telephone Encounter (Signed)
Pt called, he is bringing form from [facility] to clinic to be filled out. Pt is aware there could be a fee for filling out the form

## 2016-05-02 NOTE — Telephone Encounter (Signed)
Noted  

## 2016-05-03 NOTE — Telephone Encounter (Signed)
I called pt and LMVM for him to return call.  DMV form is not complete, (need page 2).  Was not included.  Please return call.

## 2016-05-04 NOTE — Telephone Encounter (Signed)
I spoke to pt and he will look for and bring the other sheet page 2.  We give no medications to this pt.

## 2016-05-10 NOTE — Telephone Encounter (Signed)
DMV form sent to CM/NP, who is out of office until Monday 05-14-16.  Will review, complete and sign when returns.  (no pg 2 from pt).

## 2016-05-14 NOTE — Telephone Encounter (Signed)
Completed and signed.  To MR. 

## 2016-05-16 ENCOUNTER — Telehealth: Payer: Self-pay | Admitting: *Deleted

## 2016-05-16 NOTE — Telephone Encounter (Signed)
Pt DMV form at the front desk for pick up.

## 2016-05-24 DIAGNOSIS — G809 Cerebral palsy, unspecified: Secondary | ICD-10-CM | POA: Diagnosis not present

## 2016-10-02 ENCOUNTER — Telehealth: Payer: Self-pay | Admitting: Nurse Practitioner

## 2016-10-02 NOTE — Telephone Encounter (Signed)
Pt requesting letter to be taken out of jury duty. Court date is Jan 2nd. Please call and advise [phone]

## 2016-11-15 DIAGNOSIS — R69 Illness, unspecified: Secondary | ICD-10-CM | POA: Diagnosis not present

## 2016-12-11 DIAGNOSIS — R69 Illness, unspecified: Secondary | ICD-10-CM | POA: Diagnosis not present

## 2016-12-24 DIAGNOSIS — R69 Illness, unspecified: Secondary | ICD-10-CM | POA: Diagnosis not present

## 2017-01-08 DIAGNOSIS — R69 Illness, unspecified: Secondary | ICD-10-CM | POA: Diagnosis not present

## 2017-01-10 DIAGNOSIS — R69 Illness, unspecified: Secondary | ICD-10-CM | POA: Diagnosis not present

## 2017-02-01 DIAGNOSIS — R69 Illness, unspecified: Secondary | ICD-10-CM | POA: Diagnosis not present

## 2017-02-19 DIAGNOSIS — R69 Illness, unspecified: Secondary | ICD-10-CM | POA: Diagnosis not present

## 2017-02-27 DIAGNOSIS — G809 Cerebral palsy, unspecified: Secondary | ICD-10-CM | POA: Diagnosis not present

## 2017-02-27 DIAGNOSIS — Z0000 Encounter for general adult medical examination without abnormal findings: Secondary | ICD-10-CM | POA: Diagnosis not present

## 2017-02-27 DIAGNOSIS — Z6827 Body mass index (BMI) 27.0-27.9, adult: Secondary | ICD-10-CM | POA: Diagnosis not present

## 2017-03-04 DIAGNOSIS — R69 Illness, unspecified: Secondary | ICD-10-CM | POA: Diagnosis not present

## 2017-04-01 DIAGNOSIS — R69 Illness, unspecified: Secondary | ICD-10-CM | POA: Diagnosis not present

## 2017-04-05 DIAGNOSIS — L218 Other seborrheic dermatitis: Secondary | ICD-10-CM | POA: Diagnosis not present

## 2017-04-05 DIAGNOSIS — L821 Other seborrheic keratosis: Secondary | ICD-10-CM | POA: Diagnosis not present

## 2017-04-05 DIAGNOSIS — D2272 Melanocytic nevi of left lower limb, including hip: Secondary | ICD-10-CM | POA: Diagnosis not present

## 2017-04-05 DIAGNOSIS — L820 Inflamed seborrheic keratosis: Secondary | ICD-10-CM | POA: Diagnosis not present

## 2017-04-05 DIAGNOSIS — D2271 Melanocytic nevi of right lower limb, including hip: Secondary | ICD-10-CM | POA: Diagnosis not present

## 2017-04-18 DIAGNOSIS — R69 Illness, unspecified: Secondary | ICD-10-CM | POA: Diagnosis not present

## 2017-05-08 DIAGNOSIS — R69 Illness, unspecified: Secondary | ICD-10-CM | POA: Diagnosis not present

## 2017-05-10 DIAGNOSIS — R69 Illness, unspecified: Secondary | ICD-10-CM | POA: Diagnosis not present

## 2017-05-21 DIAGNOSIS — R69 Illness, unspecified: Secondary | ICD-10-CM | POA: Diagnosis not present

## 2017-05-28 DIAGNOSIS — R69 Illness, unspecified: Secondary | ICD-10-CM | POA: Diagnosis not present

## 2017-06-21 DIAGNOSIS — R69 Illness, unspecified: Secondary | ICD-10-CM | POA: Diagnosis not present

## 2017-07-12 DIAGNOSIS — R69 Illness, unspecified: Secondary | ICD-10-CM | POA: Diagnosis not present

## 2017-07-23 DIAGNOSIS — R69 Illness, unspecified: Secondary | ICD-10-CM | POA: Diagnosis not present

## 2017-08-05 DIAGNOSIS — L218 Other seborrheic dermatitis: Secondary | ICD-10-CM | POA: Diagnosis not present

## 2017-08-05 DIAGNOSIS — D2261 Melanocytic nevi of right upper limb, including shoulder: Secondary | ICD-10-CM | POA: Diagnosis not present

## 2017-08-05 DIAGNOSIS — L821 Other seborrheic keratosis: Secondary | ICD-10-CM | POA: Diagnosis not present

## 2017-08-05 DIAGNOSIS — D225 Melanocytic nevi of trunk: Secondary | ICD-10-CM | POA: Diagnosis not present

## 2017-08-05 DIAGNOSIS — D2262 Melanocytic nevi of left upper limb, including shoulder: Secondary | ICD-10-CM | POA: Diagnosis not present

## 2017-08-12 DIAGNOSIS — R69 Illness, unspecified: Secondary | ICD-10-CM | POA: Diagnosis not present

## 2017-08-29 DIAGNOSIS — R69 Illness, unspecified: Secondary | ICD-10-CM | POA: Diagnosis not present

## 2017-09-17 DIAGNOSIS — R69 Illness, unspecified: Secondary | ICD-10-CM | POA: Diagnosis not present

## 2017-10-02 DIAGNOSIS — R69 Illness, unspecified: Secondary | ICD-10-CM | POA: Diagnosis not present

## 2017-10-23 DIAGNOSIS — R69 Illness, unspecified: Secondary | ICD-10-CM | POA: Diagnosis not present

## 2017-11-15 DIAGNOSIS — R69 Illness, unspecified: Secondary | ICD-10-CM | POA: Diagnosis not present

## 2017-11-19 DIAGNOSIS — R69 Illness, unspecified: Secondary | ICD-10-CM | POA: Diagnosis not present

## 2017-11-28 DIAGNOSIS — R69 Illness, unspecified: Secondary | ICD-10-CM | POA: Diagnosis not present

## 2017-12-19 DIAGNOSIS — R69 Illness, unspecified: Secondary | ICD-10-CM | POA: Diagnosis not present

## 2018-01-14 DIAGNOSIS — R69 Illness, unspecified: Secondary | ICD-10-CM | POA: Diagnosis not present

## 2018-01-16 DIAGNOSIS — R69 Illness, unspecified: Secondary | ICD-10-CM | POA: Diagnosis not present

## 2018-02-12 DIAGNOSIS — R69 Illness, unspecified: Secondary | ICD-10-CM | POA: Diagnosis not present

## 2018-03-06 DIAGNOSIS — R69 Illness, unspecified: Secondary | ICD-10-CM | POA: Diagnosis not present

## 2018-03-19 DIAGNOSIS — G809 Cerebral palsy, unspecified: Secondary | ICD-10-CM | POA: Diagnosis not present

## 2018-03-19 DIAGNOSIS — R269 Unspecified abnormalities of gait and mobility: Secondary | ICD-10-CM | POA: Diagnosis not present

## 2018-03-19 DIAGNOSIS — Z87891 Personal history of nicotine dependence: Secondary | ICD-10-CM | POA: Diagnosis not present

## 2018-05-20 DIAGNOSIS — R69 Illness, unspecified: Secondary | ICD-10-CM | POA: Diagnosis not present

## 2018-11-24 DIAGNOSIS — R69 Illness, unspecified: Secondary | ICD-10-CM | POA: Diagnosis not present

## 2019-09-08 DIAGNOSIS — G809 Cerebral palsy, unspecified: Secondary | ICD-10-CM | POA: Diagnosis not present

## 2019-09-08 DIAGNOSIS — Z9181 History of falling: Secondary | ICD-10-CM | POA: Diagnosis not present

## 2019-09-08 DIAGNOSIS — R269 Unspecified abnormalities of gait and mobility: Secondary | ICD-10-CM | POA: Diagnosis not present

## 2021-09-12 DIAGNOSIS — R030 Elevated blood-pressure reading, without diagnosis of hypertension: Secondary | ICD-10-CM | POA: Diagnosis not present

## 2021-09-12 DIAGNOSIS — G809 Cerebral palsy, unspecified: Secondary | ICD-10-CM | POA: Diagnosis not present

## 2021-09-12 DIAGNOSIS — Z809 Family history of malignant neoplasm, unspecified: Secondary | ICD-10-CM | POA: Diagnosis not present

## 2021-09-12 DIAGNOSIS — Z9181 History of falling: Secondary | ICD-10-CM | POA: Diagnosis not present

## 2021-09-12 DIAGNOSIS — I951 Orthostatic hypotension: Secondary | ICD-10-CM | POA: Diagnosis not present

## 2021-09-12 DIAGNOSIS — Z87891 Personal history of nicotine dependence: Secondary | ICD-10-CM | POA: Diagnosis not present

## 2022-11-02 DIAGNOSIS — F329 Major depressive disorder, single episode, unspecified: Secondary | ICD-10-CM | POA: Diagnosis not present
